# Patient Record
Sex: Female | Born: 1965 | Race: White | Hispanic: No | Marital: Married | Smoking: Former smoker
Health system: Southern US, Community
[De-identification: ages and names within clinical notes are randomized; demographics above are authoritative.]

## PROBLEM LIST (undated history)

## (undated) ENCOUNTER — Ambulatory Visit: Payer: BC Managed Care – PPO

## (undated) DIAGNOSIS — F329 Major depressive disorder, single episode, unspecified: Secondary | ICD-10-CM

## (undated) DIAGNOSIS — Z8041 Family history of malignant neoplasm of ovary: Secondary | ICD-10-CM

## (undated) DIAGNOSIS — F32A Depression, unspecified: Secondary | ICD-10-CM

## (undated) HISTORY — DX: Major depressive disorder, single episode, unspecified: F32.9

## (undated) HISTORY — PX: ABDOMINAL HYSTERECTOMY: SHX81

## (undated) HISTORY — DX: Family history of malignant neoplasm of ovary: Z80.41

## (undated) HISTORY — DX: Depression, unspecified: F32.A

---

## 2005-04-03 ENCOUNTER — Ambulatory Visit: Payer: Self-pay | Admitting: Gynecology

## 2005-06-18 ENCOUNTER — Ambulatory Visit (HOSPITAL_COMMUNITY): Admission: RE | Admit: 2005-06-18 | Discharge: 2005-06-19 | Payer: Self-pay | Admitting: Gynecology

## 2005-06-18 ENCOUNTER — Ambulatory Visit: Payer: Self-pay | Admitting: Gynecology

## 2005-08-03 ENCOUNTER — Ambulatory Visit: Payer: Self-pay | Admitting: Gynecology

## 2009-02-22 ENCOUNTER — Ambulatory Visit: Payer: Self-pay | Admitting: Internal Medicine

## 2011-12-07 ENCOUNTER — Ambulatory Visit: Payer: Self-pay | Admitting: Internal Medicine

## 2012-09-07 DIAGNOSIS — F329 Major depressive disorder, single episode, unspecified: Secondary | ICD-10-CM | POA: Insufficient documentation

## 2012-12-30 DIAGNOSIS — Z8349 Family history of other endocrine, nutritional and metabolic diseases: Secondary | ICD-10-CM | POA: Insufficient documentation

## 2013-01-30 ENCOUNTER — Ambulatory Visit: Payer: Self-pay | Admitting: Family Medicine

## 2013-02-02 DIAGNOSIS — M5020 Other cervical disc displacement, unspecified cervical region: Secondary | ICD-10-CM | POA: Insufficient documentation

## 2014-06-25 DIAGNOSIS — Z8679 Personal history of other diseases of the circulatory system: Secondary | ICD-10-CM | POA: Insufficient documentation

## 2016-03-27 ENCOUNTER — Ambulatory Visit (INDEPENDENT_AMBULATORY_CARE_PROVIDER_SITE_OTHER): Payer: BLUE CROSS/BLUE SHIELD | Admitting: Advanced Practice Midwife

## 2016-03-27 ENCOUNTER — Encounter: Payer: Self-pay | Admitting: Advanced Practice Midwife

## 2016-03-27 VITALS — BP 120/74 | HR 91 | Ht 65.0 in | Wt 174.0 lb

## 2016-03-27 DIAGNOSIS — Z113 Encounter for screening for infections with a predominantly sexual mode of transmission: Secondary | ICD-10-CM | POA: Diagnosis not present

## 2016-03-27 DIAGNOSIS — Z124 Encounter for screening for malignant neoplasm of cervix: Secondary | ICD-10-CM

## 2016-03-27 DIAGNOSIS — Z87891 Personal history of nicotine dependence: Secondary | ICD-10-CM | POA: Insufficient documentation

## 2016-03-27 DIAGNOSIS — Z01419 Encounter for gynecological examination (general) (routine) without abnormal findings: Secondary | ICD-10-CM | POA: Diagnosis not present

## 2016-03-27 NOTE — Progress Notes (Signed)
   Subjective:     Jennifer Wilson is a 51 y.o. female and is here for a comprehensive physical exam. The patient reports no problems. She admits healthy lifestyle diet and exercise. Her anti anxiety medication is managed through her PCP. Pt admits family history of maternal aunt with ovarian cancer. She is offered and accepts [facility] genetic screening today.   Social History   Social History  . Marital status: Married    Spouse name: N/A  . Number of children: N/A  . Years of education: N/A   Occupational History  . Not on file.   Social History Main Topics  . Smoking status: Former Research scientist (life sciences)  . Smokeless tobacco: Never Used  . Alcohol use Yes  . Drug use: No  . Sexual activity: Yes    Birth control/ protection: Surgical     Comment: Hysterectomy   Other Topics Concern  . Not on file   Social History Narrative  . No narrative on file   Health Maintenance  Topic Date Due  . HIV Screening  03/06/1979  . TETANUS/TDAP  03/06/1983  . PAP SMEAR  03/05/1985  . MAMMOGRAM  03/05/2014  . COLONOSCOPY  03/05/2014  . INFLUENZA VACCINE  08/13/2015    Review of Systems A comprehensive review of systems was negative.   Objective:    General appearance: alert, cooperative, appears stated age and no distress Neck: no adenopathy, no carotid bruit, no JVD, supple, symmetrical, trachea midline and thyroid not enlarged, symmetric, no tenderness/mass/nodules Lungs: clear to auscultation bilaterally Breasts: normal appearance, no masses or tenderness Heart: regular rate and rhythm Abdomen: soft, non-tender; bowel sounds normal; no masses,  no organomegaly Pelvic: external genitalia normal, no adnexal masses or tenderness, rectovaginal septum normal, vagina normal without discharge and S/P hysterectomy/no cervix Extremities: extremities normal, atraumatic, no cyanosis or edema Neurologic: Grossly normal    Assessment:    Healthy female exam. No PAP smear     Plan:   1. Schedule  screening mammogram 2. Recommend scheduling screening colonoscopy through PCP 3. RTC in 1 year for annual well woman exam   Rod Can, CNM

## 2016-04-06 ENCOUNTER — Encounter: Payer: Self-pay | Admitting: Obstetrics and Gynecology

## 2021-01-23 ENCOUNTER — Other Ambulatory Visit: Payer: Self-pay | Admitting: Family Medicine

## 2021-01-23 ENCOUNTER — Ambulatory Visit
Admission: RE | Admit: 2021-01-23 | Discharge: 2021-01-23 | Disposition: A | Discharge status: Home / Self Care | Payer: BC Managed Care – PPO | Source: Ambulatory Visit | Attending: Family Medicine | Admitting: Family Medicine

## 2021-01-23 ENCOUNTER — Ambulatory Visit: Admission: RE | Admit: 2021-01-23 | Payer: BC Managed Care – PPO | Source: Ambulatory Visit

## 2021-01-23 ENCOUNTER — Other Ambulatory Visit: Payer: Self-pay

## 2021-01-23 DIAGNOSIS — M7989 Other specified soft tissue disorders: Secondary | ICD-10-CM | POA: Insufficient documentation

## 2022-11-08 IMAGING — US US EXTREM LOW VENOUS*R*
1 series · 14 of 24 positions shown · non-contrast
Comparison: None.

CLINICAL DATA: Right lower extremity swelling.

EXAM:
Right LOWER EXTREMITY VENOUS DOPPLER ULTRASOUND
TECHNIQUE: Gray-scale sonography with compression, as well as color and duplex
ultrasound, were performed to evaluate the deep venous system(s)
from the level of the common femoral vein through the popliteal and
proximal calf veins.

[Series 1: us venous img lower uni right (dvt) · portal-venous · 14 of 39 slices shown]
[im 1/39]
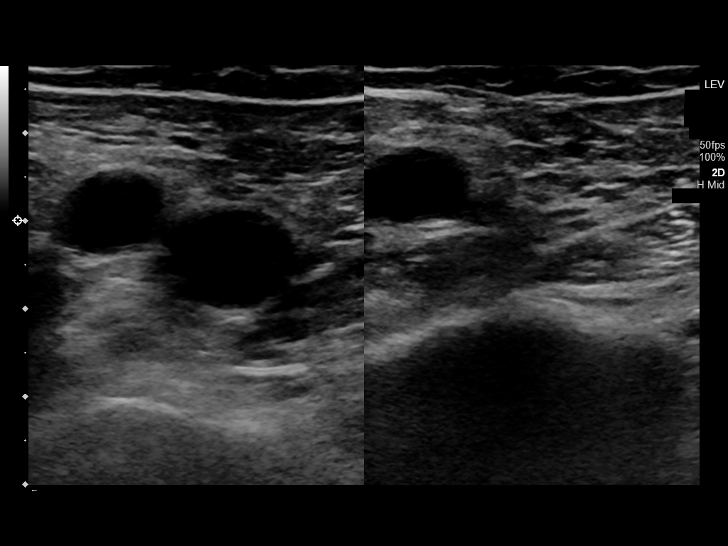
[im 4/39]
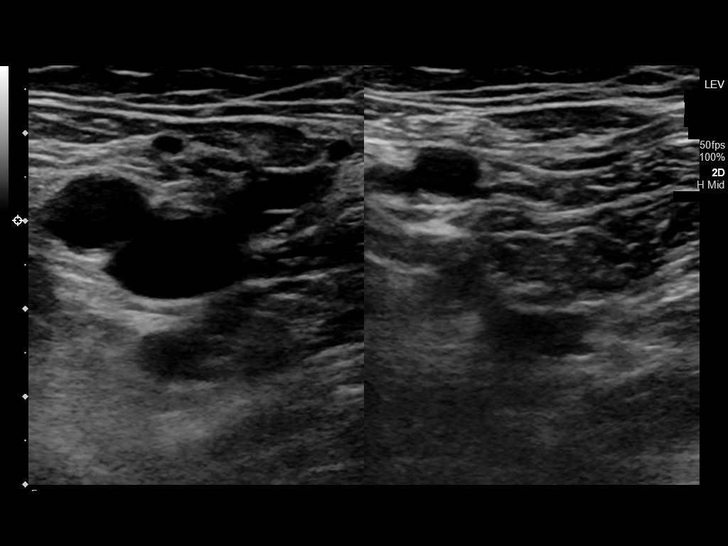
[im 7/39]
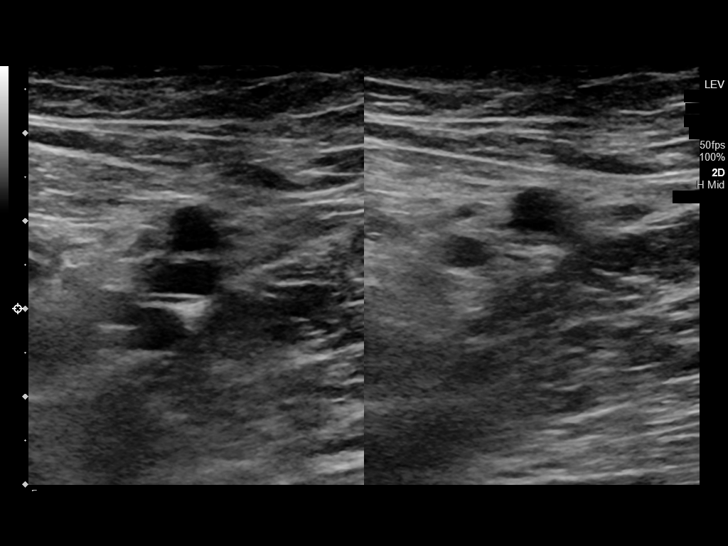
[im 10/39]
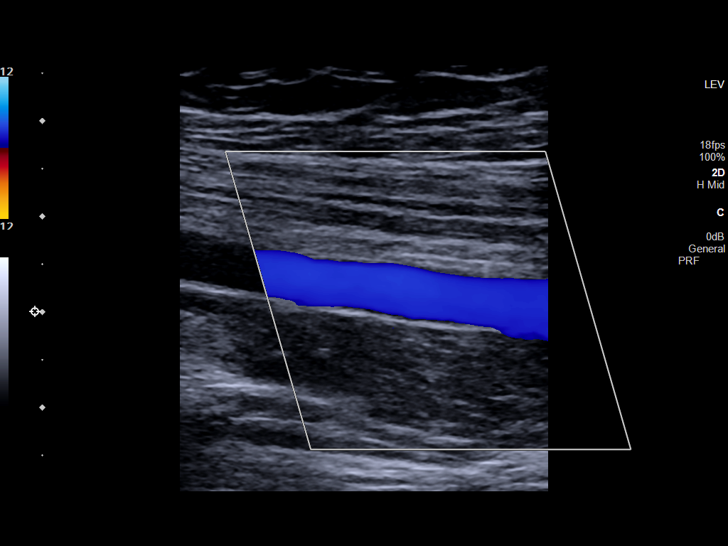
[im 12/39]
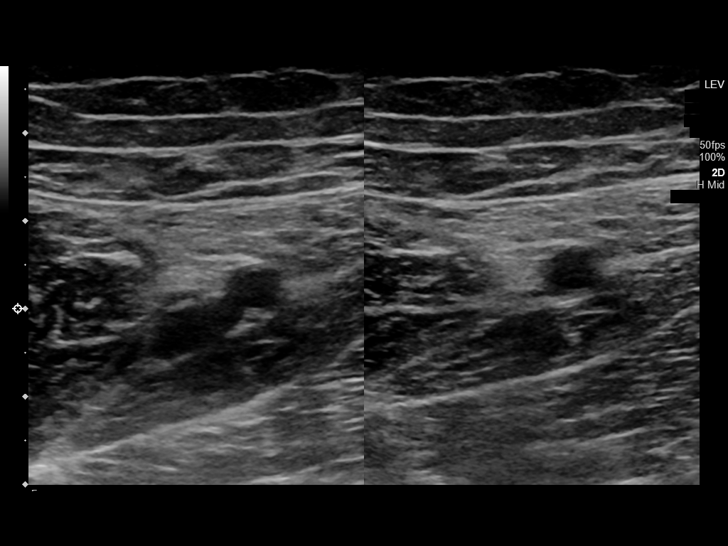
[im 15/39]
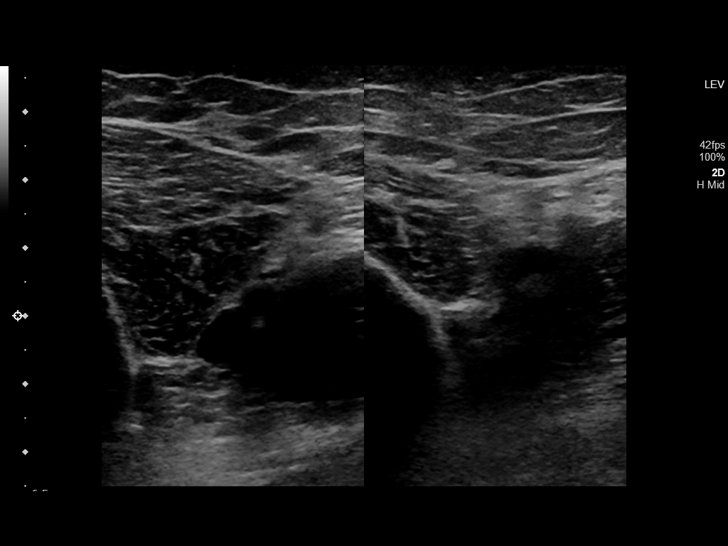
[im 19/39]
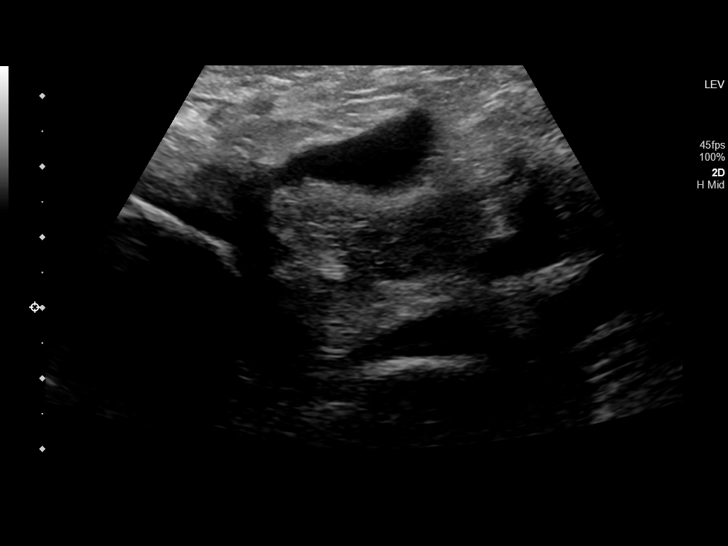
[im 20/39]
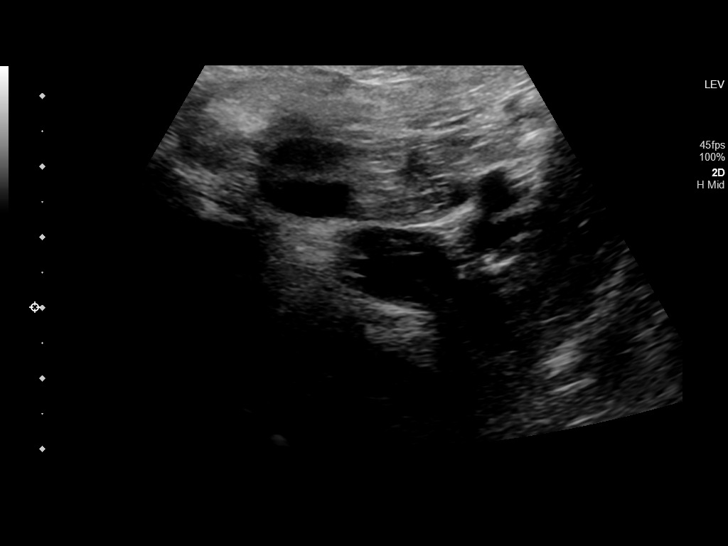
[im 24/39]
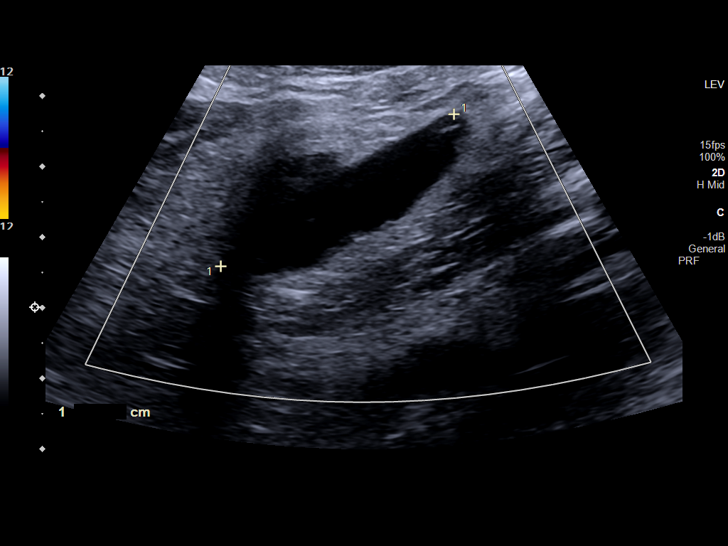
[im 27/39]
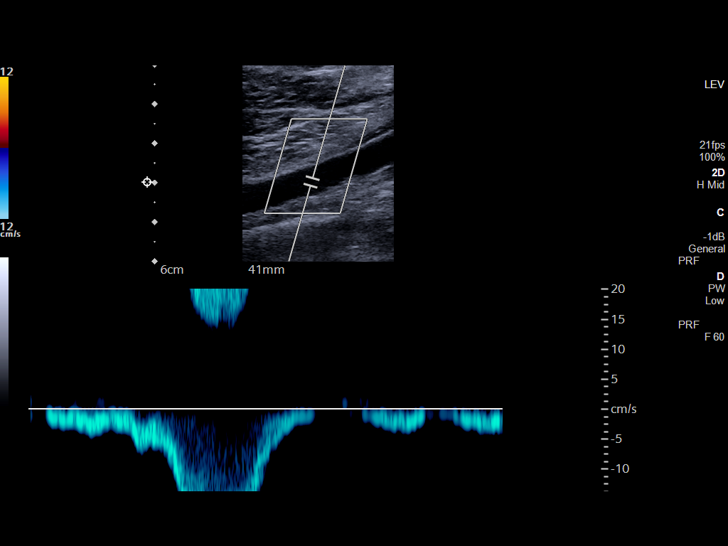
[im 30/39]
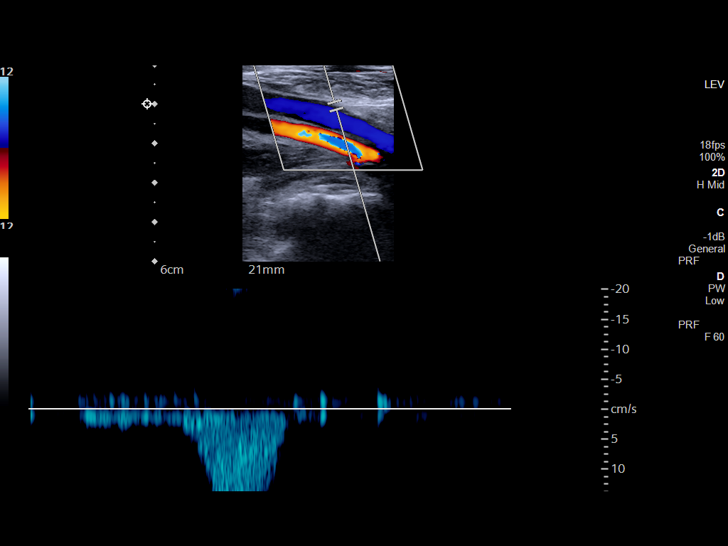
[im 32/39]
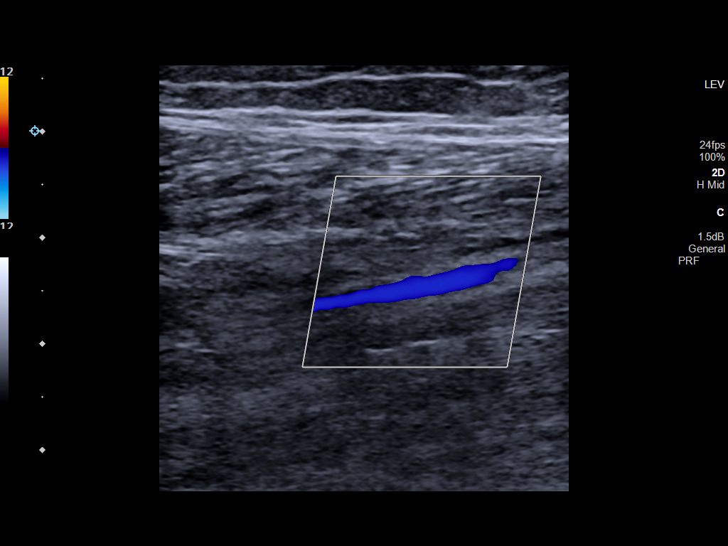
[im 35/39]
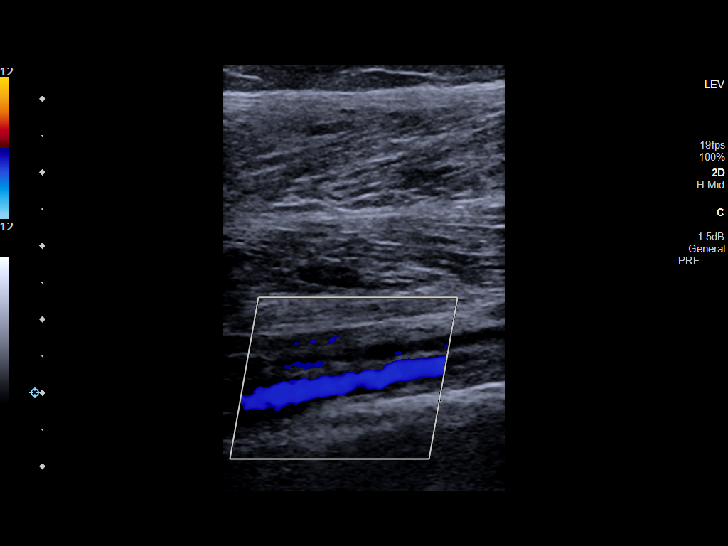
[im 39/39]
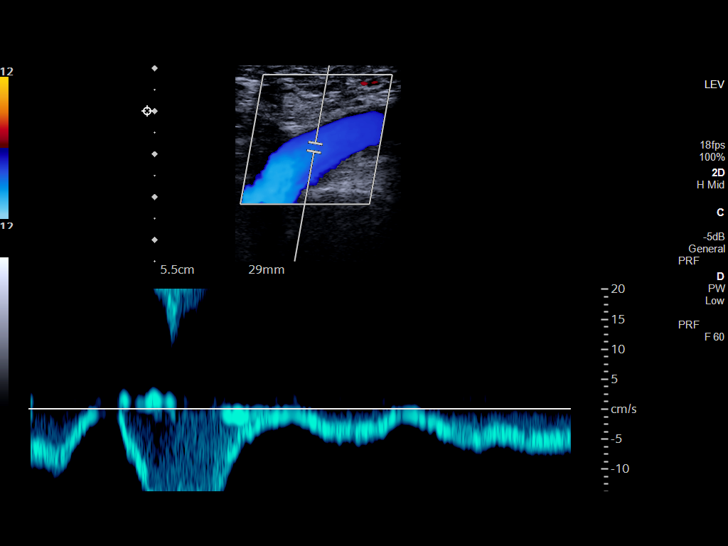

[14 of 24 positions shown; findings below may reference images not displayed]

FINDINGS: VENOUS

Normal compressibility of the common femoral, superficial femoral,
and popliteal veins, as well as the visualized calf veins.
Visualized portions of profunda femoral vein and great saphenous
vein unremarkable. No filling defects to suggest DVT on grayscale or
color Doppler imaging. Doppler waveforms show normal direction of
venous flow, normal respiratory plasticity and response to
augmentation.

Limited views of the contralateral common femoral vein are
unremarkable.

OTHER

3.9 x 2.8 x 1 cm baker cyst in the popliteal fossa.

Limitations: none
IMPRESSION: 1. No evidence of deep venous thrombosis in the right lower
extremity.
2. 3.9 cm Baker cyst

## 2023-05-05 ENCOUNTER — Other Ambulatory Visit: Payer: Self-pay | Admitting: Emergency Medicine

## 2023-05-05 DIAGNOSIS — F17210 Nicotine dependence, cigarettes, uncomplicated: Secondary | ICD-10-CM

## 2023-05-05 DIAGNOSIS — Z122 Encounter for screening for malignant neoplasm of respiratory organs: Secondary | ICD-10-CM

## 2023-05-10 ENCOUNTER — Ambulatory Visit
Admission: RE | Admit: 2023-05-10 | Discharge: 2023-05-10 | Disposition: A | Discharge status: Home / Self Care | Source: Ambulatory Visit | Attending: Emergency Medicine | Admitting: Emergency Medicine

## 2023-05-10 DIAGNOSIS — F17210 Nicotine dependence, cigarettes, uncomplicated: Secondary | ICD-10-CM | POA: Insufficient documentation

## 2023-05-10 DIAGNOSIS — Z122 Encounter for screening for malignant neoplasm of respiratory organs: Secondary | ICD-10-CM | POA: Diagnosis present
